# Patient Record
Sex: Female | Born: 1975 | Race: White | Hispanic: No | Marital: Single | Smoking: Former smoker
Health system: Southern US, Community
[De-identification: ages and names within clinical notes are randomized; demographics above are authoritative.]

## PROBLEM LIST (undated history)

## (undated) HISTORY — PX: APPENDECTOMY: SHX54

## (undated) HISTORY — PX: HIP SURGERY: SHX245

---

## 2004-03-29 ENCOUNTER — Ambulatory Visit (HOSPITAL_COMMUNITY): Admission: RE | Admit: 2004-03-29 | Discharge: 2004-03-29 | Payer: Self-pay | Admitting: Family Medicine

## 2006-05-18 ENCOUNTER — Ambulatory Visit (HOSPITAL_COMMUNITY): Admission: RE | Admit: 2006-05-18 | Discharge: 2006-05-18 | Payer: Self-pay | Admitting: *Deleted

## 2006-07-22 ENCOUNTER — Ambulatory Visit (HOSPITAL_COMMUNITY): Admission: RE | Admit: 2006-07-22 | Discharge: 2006-07-22 | Payer: Self-pay | Admitting: *Deleted

## 2010-07-09 NOTE — Op Note (Signed)
NAME:  Courtney Wilkins, Courtney Wilkins                ACCOUNT NO.:  [id number]   MEDICAL RECORD NO.:  [id number]          PATIENT TYPE:  AMB   LOCATION:  SDC                           FACILITY:  [facility]   PHYSICIAN:   B. Earlene Plater, M.D.  DATE OF BIRTH:  [birth year]   DATE OF PROCEDURE:  07/22/2006  DATE OF DISCHARGE:                               OPERATIVE REPORT   PREOPERATIVE DIAGNOSES:  1. Infertility.  2. Dysmenorrhea.   POSTOPERATIVE DIAGNOSES:  1. Infertility.  2. Dysmenorrhea.   PROCEDURE:  1. Diagnostic open laparoscopy.  2. Carbon dioxide laser ablation of endometriosis.   SURGEON:  Chester Holstein. Earlene Plater, M.D.   ASSISTANT:  None.   ANESTHESIA:  General.   SPECIMENS:  None.   BLOOD LOSS:  Minimal.   COMPLICATIONS:  None.   FINDINGS:  Endometriosis of the posterior cul-de-sac and uterosacral  ligaments, right greater than left, and right ovary posteriorly.  Otherwise, normal-appearing uterus, tubes and ovaries, normal appendix,  normal upper abdomen.   INDICATION:  Patient with the above history with a several-year history  of infertility and associated dysmenorrhea, previously normal basic  infertility workup for diagnostic laparoscopy with suspicion of  endometriosis, given the additional history of dysmenorrhea.  The  patient was advised of the risks of surgery including infection,  bleeding and damage to surrounding organs.   PROCEDURE:  The patient was taken to the operating room and general  anesthesia obtained.  She was prepped and draped in standard fashion.  Foley catheter was placed in the bladder.  Exam under anesthesia showed  a retroverted normal-size uterus.  Speculum was inserted, Hulka  tenaculum inserted and attached to the posterior lip of the cervix.   A 10-mm incision was placed in the umbilicus and carried sharply to the  fascia.  The fascia was divided sharply and elevated with the Kocher  clamps, posterior sheath and peritoneum elevated with an Allis clamp  and  entered sharply.  A pursestring suture of 0 Vicryl was placed around the  fascial defect.  Hasson cannula was inserted and secured.  Pneumoperitoneum was obtained with CO2 gas.  Trendelenburg position  obtained and a 5-mm port placed in the left lower quadrant.  Bowel was  mobilized superiorly, pelvis and abdomen inspected.  Endometriosis was  primarily in the posterior cul-de-sac, especially on the left  uterosacral ligament and left utero-ovarian ligament, as well as an  implant on the ovary posteriorly near the utero-ovarian ligament.  Each  area implant size was from 2-4 mm.  In addition, scattered 1- to 2-mm  implants were noted on the right uterosacral ligament.  The course of  each ureter was followed from the pelvic brim down to the posterior cul-  de-sac.  One implant was very close to the course of the left ureter  near the uterosacral ligament insertion at the uterus.  Therefore, this  area was ablated only to a depth of 1 mm.  The other areas were clearly  well away from the ureter and were burned to a depth of 3-4 mm with the  CO2 laser placed on 10  watts continuous.  No other abnormalities were  noted, other than some mild right pericecal adhesions to the abdominal  wall, although the appendix itself appeared normal.  Therefore, the  procedure was terminated.   The inferior port was removed; its site was hemostatic.  Scope was  removed, gas released and Hasson cannula removed.  The umbilical  incision was elevated with [employer] retractors and pursestring suture  snugged down; this obliterated the fascial defect and no intra-abdominal  contents herniated through prior to closure.  Skin was closed at the  umbilicus and the subcutaneous tissue with 4-0 Vicryl and the skin at  each site was closed with Dermabond.   The cervical Hulka tenaculum was removed, as was the Foley, and the  cervix was hemostatic.   The patient tolerated the procedure well and there were no   complications.  She was taken to the recovery room, awake, alert and in  stable condition.      Gerri Spore B. Earlene Plater, M.D.  Electronically Signed     WBD/MEDQ  D:  07/22/2006  T:  07/22/2006  Job:  [id number]

## 2014-11-12 ENCOUNTER — Ambulatory Visit
Admission: EM | Admit: 2014-11-12 | Discharge: 2014-11-12 | Disposition: A | Discharge status: Home / Self Care | Payer: BLUE CROSS/BLUE SHIELD | Attending: Family Medicine | Admitting: Family Medicine

## 2014-11-12 DIAGNOSIS — J069 Acute upper respiratory infection, unspecified: Secondary | ICD-10-CM

## 2014-11-12 MED ORDER — AZITHROMYCIN 250 MG PO TABS: ORAL_TABLET | ORAL | Status: DC

## 2014-11-12 NOTE — ED Provider Notes (Signed)
CSN: [id number]     Arrival date & time 11/12/14  1258 History   First MD Initiated Contact with Patient 11/12/14 1440     Chief Complaint  Patient presents with  . Fever  . Sore Throat  . Ear Fullness  . Cough   (Consider location/radiation/quality/duration/timing/severity/associated sxs/prior Treatment) HPI   This a 39 year old female who presents with fever up to 100.7 chills and hot flashes for the last 3 days. In addition she has had body aches left ear pain and severe sore throat which has lessened somewhat over the 1 day. Been coughing up green phlegm but has clear mucus from her nose. Her left ear throbbing. 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 She has not received her flu shot yet. History reviewed. No pertinent past medical history. Past Surgical History  Procedure Laterality Date  . Appendectomy    . Cesarean section     History reviewed. No pertinent family history. Social History  Substance Use Topics  . Smoking status: Former Games developer  . Smokeless tobacco: Never Used  . Alcohol Use: No   OB History    No data available     Review of Systems  Constitutional: Positive for fever, chills and fatigue.  HENT: Positive for congestion, ear pain, postnasal drip, rhinorrhea and sore throat.   Respiratory: Positive for cough.   All other systems reviewed and are negative.   Allergies  Review of patient's allergies indicates no known allergies.  Home Medications   Prior to Admission medications   Medication Sig Start Date End Date Taking? Authorizing Provider  azithromycin (ZITHROMAX Z-PAK) 250 MG tablet Take as per package instructions 11/12/14   Lutricia Feil, PA-C   Meds Ordered and Administered this Visit  Medications - No data to display  BP 106/72 mmHg  Pulse 81  Temp(Src) 97.8 F (36.6 C) (Oral)  Resp 20  Ht  (1.676 m)  Wt 154 lb (69.854 kg)  BMI 24.87 kg/m2  SpO2 100%  LMP  11/12/2014 No data found.   Physical Exam  Constitutional: She is oriented to person, place, and time. She appears well-developed and well-nourished. No distress.  HENT:  Head: Normocephalic and atraumatic.  Right Ear: External ear normal.  Nose: Nose normal.  Mouth/Throat: Oropharynx is clear and moist. No oropharyngeal exudate.  LeftTM is mildly erythematous is no retraction or bulging seen. Pharynx is mildly erythematous but without any swelling or exudate present. Assessment some tenderness to palpation of the neck just inferior to her ear no adenopathy is palpable.  Eyes: Pupils are equal, round, and reactive to light.  Neck: Neck supple. No thyromegaly present.  Pulmonary/Chest: Breath sounds normal. No respiratory distress. She has no wheezes. She has no rales.  Musculoskeletal: Normal range of motion. She exhibits no edema or tenderness.  Lymphadenopathy:    She has no cervical adenopathy.  Neurological: She is alert and oriented to person, place, and time.  Skin: Skin is warm and dry. She is not diaphoretic.  Psychiatric: She has a normal mood and affect. Her behavior is normal. Judgment and thought content normal.  Nursing note and vitals reviewed.   ED Course  Procedures (including critical care time)  Labs Review Labs Reviewed - No data to display  Imaging Review No results found.   Visual Acuity Review  Right Eye Distance:   Left Eye Distance:   Bilateral Distance:    Right Eye Near:   Left  Eye Near:    Bilateral Near:         MDM   1. URI, acute    New Prescriptions   AZITHROMYCIN (ZITHROMAX Z-PAK) 250 MG TABLET    Take as per package instructions   Plan: 1. Diagnosis reviewed with patient 2. rx as per orders; risks, benefits, potential side effects reviewed with patient 3. Recommend supportive treatment with fluids rest. Ibuprofen for fever/pain 4. F/u prn if symptoms worsen or don't improve    Lutricia Feil, PA-C 11/12/14 1516

## 2014-11-12 NOTE — ED Notes (Signed)
Patient started feeling sick about 3 days ago. Has had fever up to 100.7 she reports. She has been feeling stuffed up, sore throat , ear full/pressure, and cough.

## 2014-11-12 NOTE — Discharge Instructions (Signed)
Upper Respiratory Infection, Adult An upper respiratory infection (URI) is also sometimes known as the common cold. The upper respiratory tract includes the nose, sinuses, throat, trachea, and bronchi. Bronchi are the airways leading to the lungs. Most people improve within 1 week, but symptoms can last up to 2 weeks. A residual cough may last even longer.  CAUSES Many different viruses can infect the tissues lining the upper respiratory tract. The tissues become irritated and inflamed and often become very moist. Mucus production is also common. A cold is contagious. You can easily spread the virus to others by oral contact. This includes kissing, sharing a glass, coughing, or sneezing. Touching your mouth or nose and then touching a surface, which is then touched by another person, can also spread the virus. SYMPTOMS  Symptoms typically develop 1 to 3 days after you come in contact with a cold virus. Symptoms vary from person to person. They may include:  Runny nose.  Sneezing.  Nasal congestion.  Sinus irritation.  Sore throat.  Loss of voice (laryngitis).  Cough.  Fatigue.  Muscle aches.  Loss of appetite.  Headache.  Low-grade fever. DIAGNOSIS  You might diagnose your own cold based on familiar symptoms, since most people get a cold 2 to 3 times a year. Your caregiver can confirm this based on your exam. Most importantly, your caregiver can check that your symptoms are not due to another disease such as strep throat, sinusitis, pneumonia, asthma, or epiglottitis. Blood tests, throat tests, and X-rays are not necessary to diagnose a common cold, but they may sometimes be helpful in excluding other more serious diseases. Your caregiver will decide if any further tests are required. RISKS AND COMPLICATIONS  You may be at risk for a more severe case of the common cold if you smoke cigarettes, have chronic heart disease (such as heart failure) or lung disease (such as asthma), or if  you have a weakened immune system. The very young and very old are also at risk for more serious infections. Bacterial sinusitis, middle ear infections, and bacterial pneumonia can complicate the common cold. The common cold can worsen asthma and chronic obstructive pulmonary disease (COPD). Sometimes, these complications can require emergency medical care and may be life-threatening. PREVENTION  The best way to protect against getting a cold is to practice good hygiene. Avoid oral or hand contact with people with cold symptoms. Wash your hands often if contact occurs. There is no clear evidence that vitamin C, vitamin E, echinacea, or exercise reduces the chance of developing a cold. However, it is always recommended to get plenty of rest and practice good nutrition. TREATMENT  Treatment is directed at relieving symptoms. There is no cure. Antibiotics are not effective, because the infection is caused by a virus, not by bacteria. Treatment may include:  Increased fluid intake. Sports drinks offer valuable electrolytes, sugars, and fluids.  Breathing heated mist or steam (vaporizer or shower).  Eating chicken soup or other clear broths, and maintaining good nutrition.  Getting plenty of rest.  Using gargles or lozenges for comfort.  Controlling fevers with ibuprofen or acetaminophen as directed by your caregiver.  Increasing usage of your inhaler if you have asthma. Zinc gel and zinc lozenges, taken in the first 24 hours of the common cold, can shorten the duration and lessen the severity of symptoms. Pain medicines may help with fever, muscle aches, and throat pain. A variety of non-prescription medicines are available to treat congestion and runny nose. Your caregiver   can make recommendations and may suggest nasal or lung inhalers for other symptoms.  HOME CARE INSTRUCTIONS   Only take over-the-counter or prescription medicines for pain, discomfort, or fever as directed by your  caregiver.  Use a warm mist humidifier or inhale steam from a shower to increase air moisture. This may keep secretions moist and make it easier to breathe.  Drink enough water and fluids to keep your urine clear or pale yellow.  Rest as needed.  Return to work when your temperature has returned to normal or as your caregiver advises. You may need to stay home longer to avoid infecting others. You can also use a face mask and careful hand washing to prevent spread of the virus. SEEK MEDICAL CARE IF:   After the first few days, you feel you are getting worse rather than better.  You need your caregiver's advice about medicines to control symptoms.  You develop chills, worsening shortness of breath, or brown or red sputum. These may be signs of pneumonia.  You develop yellow or brown nasal discharge or pain in the face, especially when you bend forward. These may be signs of sinusitis.  You develop a fever, swollen neck glands, pain with swallowing, or white areas in the back of your throat. These may be signs of strep throat. SEEK IMMEDIATE MEDICAL CARE IF:   You have a fever.  You develop severe or persistent headache, ear pain, sinus pain, or chest pain.  You develop wheezing, a prolonged cough, cough up blood, or have a change in your usual mucus (if you have chronic lung disease).  You develop sore muscles or a stiff neck. Document Released: 08/06/2000 Document Revised: 05/05/2011 Document Reviewed: 05/18/2013 ExitCare Patient Information 2015 [employer]. This information is not intended to replace advice given to you by your health care provider. Make sure you discuss any questions you have with your health care provider.  

## 2018-09-28 ENCOUNTER — Other Ambulatory Visit: Payer: Self-pay

## 2018-09-28 DIAGNOSIS — Z20822 Contact with and (suspected) exposure to covid-19: Secondary | ICD-10-CM

## 2018-09-29 LAB — NOVEL CORONAVIRUS, NAA: SARS-CoV-2, NAA: NOT DETECTED

## 2019-03-04 ENCOUNTER — Ambulatory Visit: Payer: BC Managed Care – PPO | Attending: Internal Medicine

## 2019-03-15 ENCOUNTER — Ambulatory Visit: Payer: BC Managed Care – PPO | Attending: Internal Medicine

## 2019-03-15 DIAGNOSIS — Z20822 Contact with and (suspected) exposure to covid-19: Secondary | ICD-10-CM | POA: Insufficient documentation

## 2019-03-16 LAB — NOVEL CORONAVIRUS, NAA: SARS-CoV-2, NAA: NOT DETECTED

## 2019-03-18 ENCOUNTER — Ambulatory Visit: Payer: BC Managed Care – PPO | Attending: Internal Medicine

## 2019-03-18 DIAGNOSIS — Z20822 Contact with and (suspected) exposure to covid-19: Secondary | ICD-10-CM

## 2019-03-20 LAB — NOVEL CORONAVIRUS, NAA: SARS-CoV-2, NAA: DETECTED — AB

## 2019-12-05 ENCOUNTER — Ambulatory Visit: Payer: Self-pay

## 2019-12-05 ENCOUNTER — Ambulatory Visit (INDEPENDENT_AMBULATORY_CARE_PROVIDER_SITE_OTHER): Payer: BC Managed Care – PPO | Admitting: Orthopaedic Surgery

## 2019-12-05 VITALS — Ht 66.0 in | Wt 133.0 lb

## 2019-12-05 DIAGNOSIS — M25559 Pain in unspecified hip: Secondary | ICD-10-CM

## 2019-12-05 DIAGNOSIS — M25551 Pain in right hip: Secondary | ICD-10-CM | POA: Diagnosis not present

## 2019-12-05 MED ORDER — METHYLPREDNISOLONE 4 MG PO TABS
ORAL_TABLET | ORAL | 0 refills | Status: DC
Start: 2019-12-05 — End: 2022-11-10

## 2019-12-05 NOTE — Progress Notes (Signed)
Office Visit Note   Patient: Courtney Wilkins           Date of Birth: 10/01/1975           MRN: [id number] Visit Date: 12/05/2019              Requested by: No referring provider defined for this encounter. PCP: Patient, No Pcp Per   Assessment & Plan: Visit Diagnoses:  1. Hip pain     Plan: I do feel good that the patient is feeling little bit better than that she did 2 weeks ago when she was even having problems weightbearing.  She has not taken any anti-inflammatories so I like to try a 6-day steroid taper and have her avoid significant pivoting activities or trying to "pop her hip".  My neck step would be to obtain an MRI arthrogram if she continues to have the stabbing pain in the groin area.  We will reevaluate her in 2 weeks.  All questions and concerns were answered and addressed.  Follow-Up Instructions: Return in about 2 years (around 12/04/2021).   Orders:  Orders Placed This Encounter  Procedures  . XR HIP UNILAT W OR W/O PELVIS 1V RIGHT   Meds ordered this encounter  Medications  . methylPREDNISolone (MEDROL) 4 MG tablet    Sig: Medrol dose pack. Take as instructed    Dispense:  21 tablet    Refill:  0      Procedures: No procedures performed   Clinical Data: No additional findings.   Subjective: Chief Complaint  Patient presents with  . Right Hip - Pain  Patient is a very pleasant 44 year old female who reports a 2-week history of right hip pain.  She got a severe sharp pain in the groin when she was pivoting getting out of a booth at [employer].  She had difficulty weightbearing after that.  Right now she states that she feels like her hip needs to "pop".  She still reports pain in the groin and deep in the hip joint itself.  She has been trying to limit her activities.  She works as a Armed forces operational officer.  She never has injured that right hip before.  She denies any other acute change in medical status.  HPI  Review of Systems There is currently no  reported headache, chest pain, shortness of breath, fever, chills, nausea, vomiting  Objective: Vital Signs: Ht 5\' 6"  (1.676 m)   Wt 133 lb (60.3 kg)   BMI 21.47 kg/m   Physical Exam She is alert and oriented x3 and in no acute distress Ortho Exam Examination of her right hip shows that it moves smoothly and fluidly but does have pain in the groin with extremes of rotation.  Her abduction and flexion are normal as well as her rotation. Specialty Comments:  No specialty comments available.  Imaging: XR HIP UNILAT W OR W/O PELVIS 1V RIGHT  Result Date: 12/05/2019 An AP and lateral of the right hip including AP of the pelvis shows no acute findings.    PMFS History: There are no problems to display for this patient.  No past medical history on file.  No family history on file.  Past Surgical History:  Procedure Laterality Date  . APPENDECTOMY    . CESAREAN SECTION     Social History   Occupational History  . Not on file  Tobacco Use  . Smoking status: Former 02/04/2020  . Smokeless tobacco: Never Used  Substance and Sexual Activity  .  Alcohol use: No  . Drug use: Not on file  . Sexual activity: Not on file

## 2019-12-19 ENCOUNTER — Encounter: Payer: Self-pay | Admitting: Orthopaedic Surgery

## 2019-12-19 ENCOUNTER — Ambulatory Visit (INDEPENDENT_AMBULATORY_CARE_PROVIDER_SITE_OTHER): Payer: BC Managed Care – PPO | Admitting: Orthopaedic Surgery

## 2019-12-19 DIAGNOSIS — M25551 Pain in right hip: Secondary | ICD-10-CM | POA: Diagnosis not present

## 2019-12-19 NOTE — Progress Notes (Signed)
The patient comes in with continued right hip and groin pain.  This is only in the groin area.  This occurred after she felt a pop in that hip when she was pivoting.  It still wakes her up at night.  It still hurts with weightbearing and when she pivots with her right hip.  She has never injured this before.  We tried to get this to calm down with just oral anti-inflammatories as well as activity modification and rest.  We even tried a steroid taper.  Examination of her right hip still shows pain in the groin with internal and external rotation of the right hip.  Her plain films were reviewed of her pelvis and right hip and it shows a normal-appearing hip.  At this point I am concerned about her having a labral tear based on her clinical exam as well as her story and signs and symptoms.  We do need to obtain a MRI arthrogram of the right hip to rule out a labral tear.  She agrees with this treatment plan.  All questions and concerns were answered and addressed.  We will see her back in follow-up after the MRI.

## 2019-12-21 ENCOUNTER — Other Ambulatory Visit: Payer: Self-pay

## 2019-12-21 DIAGNOSIS — M25559 Pain in unspecified hip: Secondary | ICD-10-CM

## 2020-01-09 ENCOUNTER — Encounter: Payer: Self-pay | Admitting: Orthopaedic Surgery

## 2020-01-10 ENCOUNTER — Other Ambulatory Visit: Payer: Self-pay | Admitting: Orthopaedic Surgery

## 2020-01-10 NOTE — Telephone Encounter (Signed)
Pt is already schedued at [facility] imaging

## 2020-01-27 ENCOUNTER — Ambulatory Visit
Admission: RE | Admit: 2020-01-27 | Discharge: 2020-01-27 | Disposition: A | Discharge status: Home / Self Care | Payer: BC Managed Care – PPO | Source: Ambulatory Visit | Attending: Orthopaedic Surgery | Admitting: Orthopaedic Surgery

## 2020-01-27 ENCOUNTER — Other Ambulatory Visit: Payer: Self-pay

## 2020-01-27 DIAGNOSIS — M25559 Pain in unspecified hip: Secondary | ICD-10-CM

## 2020-01-27 MED ORDER — IOPAMIDOL (ISOVUE-M 200) INJECTION 41%
15.0000 mL | Freq: Once | INTRAMUSCULAR | Status: AC
  Administered 2020-01-27: 15 mL via INTRA_ARTICULAR

## 2020-01-30 ENCOUNTER — Telehealth: Payer: Self-pay | Admitting: Orthopaedic Surgery

## 2020-01-30 DIAGNOSIS — M25559 Pain in unspecified hip: Secondary | ICD-10-CM

## 2020-01-30 NOTE — Telephone Encounter (Signed)
Patient called asked if she can be called with the results of her MRI and what the next plan of care will be? The number to contact patient is [phone]

## 2020-01-30 NOTE — Telephone Encounter (Signed)
Referral placed in chart  

## 2020-01-30 NOTE — Telephone Encounter (Signed)
I spoke with her.  She needs a referral to [facility] to Dr. 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

## 2020-01-31 ENCOUNTER — Telehealth: Payer: Self-pay | Admitting: Orthopaedic Surgery

## 2020-01-31 DIAGNOSIS — M25551 Pain in right hip: Secondary | ICD-10-CM

## 2020-01-31 NOTE — Telephone Encounter (Signed)
Do you have this number?

## 2020-01-31 NOTE — Telephone Encounter (Signed)
Pt called asking for the phone number to the facility she was referred to; pt states it's ok to leave a message with info. if she doesn't answer  [phone]

## 2020-02-01 NOTE — Telephone Encounter (Signed)
Is this ok?

## 2020-02-01 NOTE — Telephone Encounter (Signed)
That will be fine.  It does sometimes take a while to get into any office.  I am fine with the referral to Dr. 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

## 2020-02-01 NOTE — Telephone Encounter (Signed)
Pt called back and still hasn't heard anything regarding her referral to Dr. Caswell Corwin office at [facility]. Pt asked if she can be referred to Dr. Junita Push at [facility] due to the fact that her pain is getting worse by the day.

## 2020-02-02 ENCOUNTER — Telehealth: Payer: Self-pay

## 2020-02-02 NOTE — Telephone Encounter (Signed)
Patient called stating that Dr. Aundria Rud office states that they can't get her in for surgery until the new year. She states that starting the new year her insurance is changing and per Dr. Aundria Rud office that particular insurance will make her have cortisone injection in the hip first which she does not think will help. She just wanted to speak to you about all this I told her more than likely anyone we send her to may be booked for surgery until the first of the year, that it is just that time of year

## 2020-02-02 NOTE — Telephone Encounter (Signed)
Referral placed in chart  

## 2020-02-02 NOTE — Telephone Encounter (Signed)
I agree with you.  There is unfortunately nothing that I can really do at all.  Everybody has their surgeries booked up and are backed up.  Some of this is related to a multitude of surgeries that are needed by the end of the year as well as staffing issues.  The only thing I can recommend in the interim would be a steroid injection but there is nothing else I can recommend from a surgical standpoint or referral standpoint.

## 2020-02-02 NOTE — Telephone Encounter (Signed)
Patient called she sated she would like a call back regarding a referral she received she wants to let doctor blackman know what the office is saying. CB:[phone]

## 2020-02-03 NOTE — Telephone Encounter (Signed)
Patient aware of the below message  

## 2020-02-09 ENCOUNTER — Other Ambulatory Visit: Payer: Self-pay | Admitting: Rehabilitative and Restorative Service Providers"

## 2020-02-09 DIAGNOSIS — M25551 Pain in right hip: Secondary | ICD-10-CM

## 2020-02-16 ENCOUNTER — Encounter: Payer: Self-pay | Admitting: Radiology

## 2020-02-16 ENCOUNTER — Ambulatory Visit
Admission: RE | Admit: 2020-02-16 | Discharge: 2020-02-16 | Disposition: A | Discharge status: Home / Self Care | Payer: BC Managed Care – PPO | Source: Ambulatory Visit | Attending: Rehabilitative and Restorative Service Providers" | Admitting: Rehabilitative and Restorative Service Providers"

## 2020-02-16 DIAGNOSIS — M25551 Pain in right hip: Secondary | ICD-10-CM

## 2020-02-16 MED ORDER — TRIAMCINOLONE ACETONIDE 40 MG/ML IJ SUSP (RADIOLOGY)
40.0000 mg | Freq: Once | INTRAMUSCULAR | Status: AC
  Administered 2020-02-16: 40 mg via INTRA_ARTICULAR

## 2020-02-16 MED ORDER — IOPAMIDOL (ISOVUE-M 200) INJECTION 41%
1.0000 mL | Freq: Once | INTRAMUSCULAR | Status: AC
  Administered 2020-02-16: 1 mL via INTRA_ARTICULAR

## 2021-01-16 ENCOUNTER — Other Ambulatory Visit: Payer: Self-pay | Admitting: Obstetrics and Gynecology

## 2021-01-16 DIAGNOSIS — Z1231 Encounter for screening mammogram for malignant neoplasm of breast: Secondary | ICD-10-CM

## 2021-02-01 ENCOUNTER — Other Ambulatory Visit: Payer: Self-pay

## 2021-02-01 ENCOUNTER — Ambulatory Visit
Admission: RE | Admit: 2021-02-01 | Discharge: 2021-02-01 | Disposition: A | Discharge status: Home / Self Care | Payer: Managed Care, Other (non HMO) | Source: Ambulatory Visit | Attending: Obstetrics and Gynecology | Admitting: Obstetrics and Gynecology

## 2021-02-01 DIAGNOSIS — Z1231 Encounter for screening mammogram for malignant neoplasm of breast: Secondary | ICD-10-CM | POA: Diagnosis present

## 2021-02-07 ENCOUNTER — Other Ambulatory Visit: Payer: Self-pay | Admitting: Obstetrics and Gynecology

## 2021-02-07 DIAGNOSIS — R928 Other abnormal and inconclusive findings on diagnostic imaging of breast: Secondary | ICD-10-CM

## 2021-02-07 DIAGNOSIS — N6489 Other specified disorders of breast: Secondary | ICD-10-CM

## 2021-02-15 ENCOUNTER — Ambulatory Visit
Admission: RE | Admit: 2021-02-15 | Discharge: 2021-02-15 | Disposition: A | Discharge status: Home / Self Care | Payer: Managed Care, Other (non HMO) | Source: Ambulatory Visit | Attending: Obstetrics and Gynecology | Admitting: Obstetrics and Gynecology

## 2021-02-15 ENCOUNTER — Other Ambulatory Visit: Payer: Self-pay

## 2021-02-15 DIAGNOSIS — R928 Other abnormal and inconclusive findings on diagnostic imaging of breast: Secondary | ICD-10-CM | POA: Insufficient documentation

## 2021-02-15 DIAGNOSIS — N6489 Other specified disorders of breast: Secondary | ICD-10-CM | POA: Diagnosis present

## 2021-03-21 ENCOUNTER — Other Ambulatory Visit: Payer: Self-pay | Admitting: Orthopedic Surgery

## 2021-03-21 DIAGNOSIS — M25551 Pain in right hip: Secondary | ICD-10-CM

## 2021-04-12 ENCOUNTER — Ambulatory Visit
Admission: RE | Admit: 2021-04-12 | Discharge: 2021-04-12 | Disposition: A | Discharge status: Home / Self Care | Payer: Managed Care, Other (non HMO) | Source: Ambulatory Visit | Attending: Orthopedic Surgery | Admitting: Orthopedic Surgery

## 2021-04-12 ENCOUNTER — Other Ambulatory Visit: Payer: Self-pay

## 2021-04-12 DIAGNOSIS — M25551 Pain in right hip: Secondary | ICD-10-CM

## 2021-04-12 MED ORDER — TRIAMCINOLONE ACETONIDE 40 MG/ML IJ SUSP (RADIOLOGY)
40.0000 mg | Freq: Once | INTRAMUSCULAR | Status: AC
  Administered 2021-04-12: 40 mg via INTRA_ARTICULAR

## 2021-04-12 MED ORDER — IOPAMIDOL (ISOVUE-M 200) INJECTION 41%
1.0000 mL | Freq: Once | INTRAMUSCULAR | Status: AC
  Administered 2021-04-12: 1 mL via INTRA_ARTICULAR

## 2021-07-23 ENCOUNTER — Other Ambulatory Visit: Payer: Self-pay | Admitting: Obstetrics and Gynecology

## 2021-07-23 DIAGNOSIS — N6489 Other specified disorders of breast: Secondary | ICD-10-CM

## 2021-08-09 ENCOUNTER — Ambulatory Visit
Admission: RE | Admit: 2021-08-09 | Discharge: 2021-08-09 | Disposition: A | Discharge status: Home / Self Care | Payer: Managed Care, Other (non HMO) | Source: Ambulatory Visit | Attending: Obstetrics and Gynecology | Admitting: Obstetrics and Gynecology

## 2021-08-09 DIAGNOSIS — N6489 Other specified disorders of breast: Secondary | ICD-10-CM | POA: Insufficient documentation

## 2021-12-05 ENCOUNTER — Other Ambulatory Visit: Payer: Self-pay | Admitting: Orthopedic Surgery

## 2021-12-05 DIAGNOSIS — M25551 Pain in right hip: Secondary | ICD-10-CM

## 2021-12-11 ENCOUNTER — Other Ambulatory Visit: Payer: Managed Care, Other (non HMO)

## 2021-12-18 ENCOUNTER — Other Ambulatory Visit: Payer: Managed Care, Other (non HMO)

## 2022-11-10 ENCOUNTER — Encounter: Payer: Self-pay | Admitting: Obstetrics & Gynecology

## 2022-11-10 ENCOUNTER — Other Ambulatory Visit (HOSPITAL_COMMUNITY)
Admission: RE | Admit: 2022-11-10 | Discharge: 2022-11-10 | Disposition: A | Discharge status: Home / Self Care | Payer: BC Managed Care – PPO | Source: Ambulatory Visit | Attending: Obstetrics & Gynecology | Admitting: Obstetrics & Gynecology

## 2022-11-10 ENCOUNTER — Ambulatory Visit (INDEPENDENT_AMBULATORY_CARE_PROVIDER_SITE_OTHER): Payer: BC Managed Care – PPO | Admitting: Obstetrics & Gynecology

## 2022-11-10 VITALS — BP 112/71 | HR 83 | Ht 66.0 in | Wt 133.0 lb

## 2022-11-10 DIAGNOSIS — Z1211 Encounter for screening for malignant neoplasm of colon: Secondary | ICD-10-CM

## 2022-11-10 DIAGNOSIS — Z1151 Encounter for screening for human papillomavirus (HPV): Secondary | ICD-10-CM | POA: Diagnosis not present

## 2022-11-10 DIAGNOSIS — Z1231 Encounter for screening mammogram for malignant neoplasm of breast: Secondary | ICD-10-CM

## 2022-11-10 DIAGNOSIS — R399 Unspecified symptoms and signs involving the genitourinary system: Secondary | ICD-10-CM | POA: Diagnosis not present

## 2022-11-10 DIAGNOSIS — Z01419 Encounter for gynecological examination (general) (routine) without abnormal findings: Secondary | ICD-10-CM

## 2022-11-10 DIAGNOSIS — Z1339 Encounter for screening examination for other mental health and behavioral disorders: Secondary | ICD-10-CM | POA: Diagnosis not present

## 2022-11-10 DIAGNOSIS — Z3041 Encounter for surveillance of contraceptive pills: Secondary | ICD-10-CM

## 2022-11-10 MED ORDER — NORGESTIMATE-ETH ESTRADIOL 0.25-35 MG-MCG PO TABS: 1.0000 | ORAL_TABLET | Freq: Every day | ORAL | 5 refills | Status: DC

## 2022-11-10 NOTE — Progress Notes (Signed)
GYNECOLOGY ANNUAL PREVENTATIVE CARE ENCOUNTER NOTE  History:     Courtney Wilkins is a 47 y.o. G45P0 female here for a routine annual gynecologic exam.  Current complaints: had suprapubic pain and blood in urine a few days ago, no current symptoms.  History of UTIs, desires testing for this.   Denies abnormal vaginal bleeding, discharge, pelvic pain, problems with intercourse or other gynecologic concerns.    Gynecologic History Patient's last menstrual period was 10/28/2022 (exact date). Contraception: OCP (estrogen/progesterone) Last Pap: 10/2021. Result was normal  Last Mammogram: 08/09/2021.  Result was normal  Obstetric History OB History  Gravida Para Term Preterm AB Living  2         2  SAB IAB Ectopic Multiple Live Births               # Outcome Date GA Lbr Len/2nd Weight Sex Type Anes PTL Lv  2 Gravida           1 Gravida             Obstetric Comments  2 C-Section's.     History reviewed. No pertinent past medical history.  Past Surgical History:  Procedure Laterality Date   APPENDECTOMY     CESAREAN SECTION     HIP SURGERY      No current outpatient medications on file prior to visit.   No current facility-administered medications on file prior to visit.    No Known Allergies  Social History:  reports that she has quit smoking. She has never used smokeless tobacco. She reports current alcohol use. She reports that she does not use drugs.  Family History  Problem Relation Age of Onset   Hypertension Mother    Heart attack Mother    Breast cancer Neg Hx     The following portions of the patient's history were reviewed and updated as appropriate: allergies, current medications, past family history, past medical history, past social history, past surgical history and problem list.  Review of Systems Pertinent items noted in HPI and remainder of comprehensive ROS otherwise negative.  Physical Exam:  BP 112/71   Pulse 83   Ht 5\' 6"  (1.676 m)   Wt 133  lb (60.3 kg)   LMP 10/28/2022 (Exact Date)   BMI 21.47 kg/m  CONSTITUTIONAL: Well-developed, well-nourished female in no acute distress.  HENT:  Normocephalic, atraumatic, External right and left ear normal.  EYES: Conjunctivae and EOM are normal. Pupils are equal, round, and reactive to light. No scleral icterus.  NECK: Normal range of motion, supple, no masses.  Normal thyroid.  SKIN: Skin is warm and dry. No rash noted. Not diaphoretic. No erythema. No pallor. MUSCULOSKELETAL: Normal range of motion. No tenderness.  No cyanosis, clubbing, or edema. NEUROLOGIC: Alert and oriented to person, place, and time. Normal reflexes, muscle tone coordination.  PSYCHIATRIC: Normal mood and affect. Normal behavior. Normal judgment and thought content. CARDIOVASCULAR: Normal heart rate noted, regular rhythm RESPIRATORY: Clear to auscultation bilaterally. Effort and breath sounds normal, no problems with respiration noted. BREASTS: Symmetric in size. No masses, tenderness, skin changes, nipple drainage, or lymphadenopathy bilaterally. Performed in the presence of a chaperone. ABDOMEN: Soft, no distention noted.  No tenderness, rebound or guarding.  PELVIC: Normal appearing external genitalia and urethral meatus; normal appearing vaginal mucosa and cervix.  No abnormal vaginal discharge noted.  Pap smear obtained.  Normal uterine size, no other palpable masses, no uterine or adnexal tenderness.  Performed in the presence of  a chaperone.   Assessment and Plan:    1. UTI symptoms No current symptoms but patient desires test. - Urine Culture  2. Uses oral contraception OCPs refilled. - norgestimate-ethinyl estradiol (ORTHO-CYCLEN) 0.25-35 MG-MCG tablet; Take 1 tablet by mouth daily.  Dispense: 84 tablet; Refill: 5  3. Colon cancer screening Discussed need for colon cancer screening over the age 15. Colonoscopy recommended as this is the gold standard. Patient declined colonoscopy, so discussed Cologuard.    Emphasized that positive Cologuard tests will need to be follow up with diagnostic colonoscopy. Cologuard ordered. - Cologuard  4. Breast cancer screening by mammogram Mammogram scheduled for breast cancer screening. - MM 3D SCREENING MAMMOGRAM BILATERAL BREAST; Future  5. Well woman exam with routine gynecological exam - Cytology - PAP Will follow up results of pap smear and manage accordingly. Routine preventative health maintenance measures emphasized. Please refer to After Visit Summary for other counseling recommendations.      Jaynie Collins, MD, FACOG Obstetrician & Gynecologist, [facility] for [employer], [facility]

## 2022-11-17 LAB — CYTOLOGY - PAP
Comment: NEGATIVE
Diagnosis: NEGATIVE
High risk HPV: NEGATIVE

## 2022-11-21 LAB — COLOGUARD: COLOGUARD: NEGATIVE

## 2023-08-25 ENCOUNTER — Encounter: Payer: Self-pay | Admitting: Obstetrics & Gynecology

## 2023-08-25 DIAGNOSIS — N939 Abnormal uterine and vaginal bleeding, unspecified: Secondary | ICD-10-CM

## 2023-09-10 ENCOUNTER — Telehealth: Payer: Self-pay

## 2023-09-10 MED ORDER — IBUPROFEN 800 MG PO TABS
800.0000 mg | ORAL_TABLET | Freq: Three times a day (TID) | ORAL | 1 refills | Status: AC | PRN
Start: 2023-09-10 — End: 2023-09-20

## 2023-09-10 NOTE — Telephone Encounter (Signed)
 Left message for pt to call office back to get follow up scheduled with Dr. DELENA

## 2023-09-14 ENCOUNTER — Ambulatory Visit: Admission: RE | Admit: 2023-09-14 | Source: Ambulatory Visit

## 2023-09-15 ENCOUNTER — Other Ambulatory Visit

## 2023-09-22 ENCOUNTER — Encounter: Payer: Self-pay | Admitting: Obstetrics & Gynecology

## 2023-09-22 ENCOUNTER — Ambulatory Visit: Admitting: Obstetrics & Gynecology

## 2023-09-22 VITALS — BP 99/66 | Wt 128.0 lb

## 2023-09-22 DIAGNOSIS — N939 Abnormal uterine and vaginal bleeding, unspecified: Secondary | ICD-10-CM | POA: Diagnosis not present

## 2023-09-22 NOTE — Progress Notes (Signed)
   GYNECOLOGY OFFICE VISIT NOTE  History:  Courtney Wilkins is a 48 y.o. G2P0 here today for discussion about two episodes of irregular bleeding.  Had two periods two weeks apart, this was unusual for Courtney Wilkins. Takes OCPs in a mostly continuous fashion. She is under a lot of stress lately, has a young son who is in hospice due to serious illnesses.  She has lost significant weight due to this stress.  She denies any abnormal vaginal discharge, bleeding, pelvic pain or other concerns.  History reviewed. No pertinent past medical history.  Past Surgical History:  Procedure Laterality Date   APPENDECTOMY     CESAREAN SECTION     HIP SURGERY      The following portions of the patient's history were reviewed and updated as appropriate: allergies, current medications, past family history, past medical history, past social history, past surgical history and problem list.   Health Maintenance:  Normal pap and negative HRHPV on 11/10/2022.    Review of Systems:  Pertinent items noted in HPI and remainder of comprehensive ROS otherwise negative.  Physical Exam:  BP 99/66   Wt 128 lb (58.1 kg)   BMI 20.66 kg/m  CONSTITUTIONAL: Well-developed, well-nourished female in no acute distress.  HEENT:  Normocephalic, atraumatic. External right and left ear normal. No scleral icterus.  NECK: Normal range of motion, supple, no masses noted on observation SKIN: No rash noted. Not diaphoretic. No erythema. No pallor. MUSCULOSKELETAL: Normal range of motion. No edema noted. NEUROLOGIC: Alert and oriented to person, place, and time. Normal muscle tone coordination. No cranial nerve deficit noted on observation. PSYCHIATRIC: Normal mood and affect. Normal behavior. Normal judgment and thought content. CARDIOVASCULAR: Normal heart rate noted RESPIRATORY: Effort and breath sounds normal, no problems with respiration noted ABDOMEN: No masses or other overt distention noted on observation. No tenderness.   PELVIC:  Deferred Assessment and Plan:    1. Abnormal uterine bleeding (AUB) (Primary) Support given to patient, offered resources for help and she said the hospice staff is helpful and she has a lot of friends and family. Discussed that stress, weight changes can definitely cause breakthrough bleeding episodes; this may worsen given current situation. If AUB persists/worsens/gets more concerning, lab and ultrasound evaluation may be indicated, also may need further evaluation with endometrial sampling. Will continue to observe for now.  Bleeding precautions reviewed.  She will continue OCPs for now as prescribed.  Patient was told to let us  know if we can help Courtney Wilkins in any way with Courtney Wilkins ongoing situation and with any GYN concerns.  Return for any gynecologic concerns.    I spent 30 minutes dedicated to the care of this patient including pre-visit review of records, face to face time with the patient discussing Courtney Wilkins conditions and treatments, post visit ordering of medications and appropriate tests or procedures, coordinating care and documenting this visit encounter.    GLORIS HUGGER, MD, FACOG Obstetrician & Gynecologist, [facility] for [employer],  [facility]

## 2023-10-01 IMAGING — XA DG FLUORO GUIDE NDL PLC/BX
1 series · 1 of 1 positions shown · non-contrast
Comparison: none

CLINICAL DATA: Right hip pain.  History of prior labral repair.

[Series 1: ortho adipose · 1 of 1 slices shown]
[im 1/1]
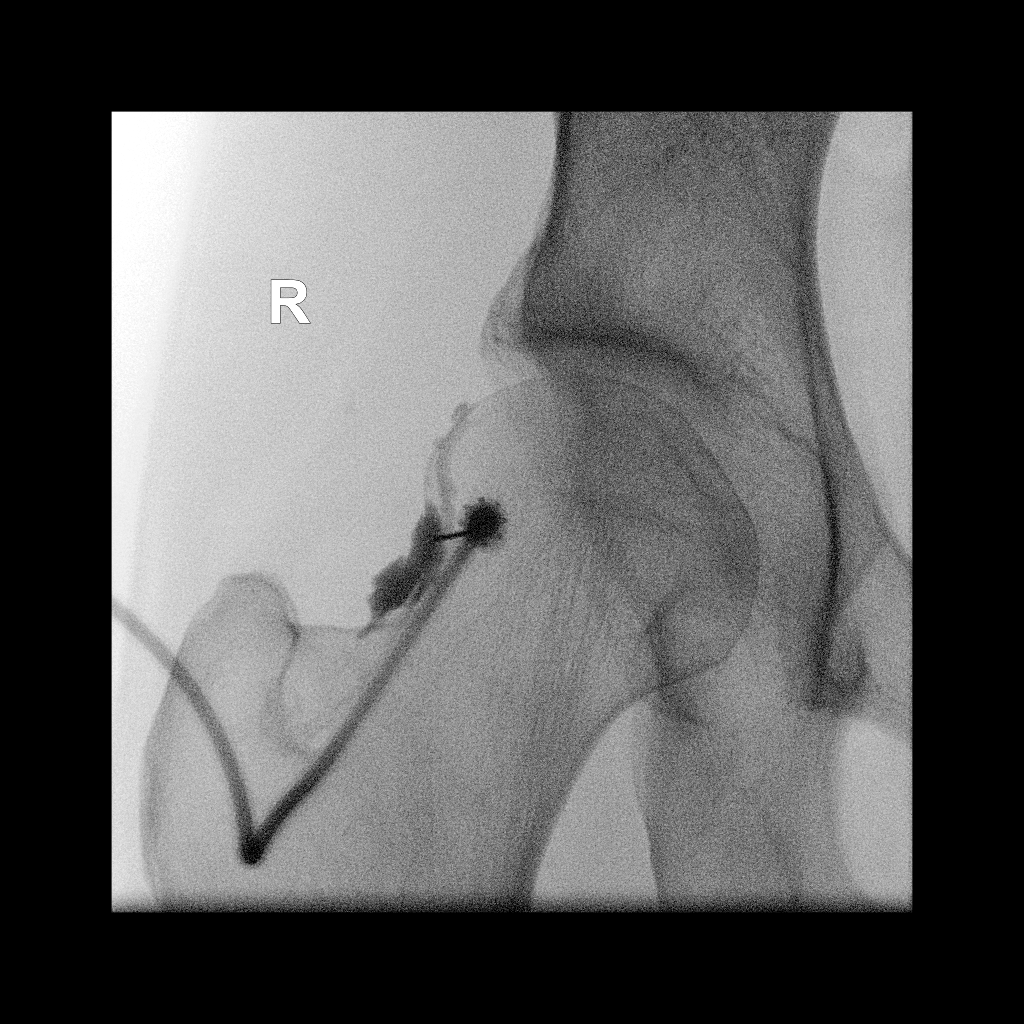

[1 of 1 positions shown; findings below may reference images not displayed]

FLUOROSCOPY TIME:  Radiation Exposure Index (as provided by the
fluoroscopic device): 1.5 mGy Kerma

PROCEDURE:
The risks and benefits of the procedure were discussed with the
patient, and written informed consent was obtained. The patient
stated no history of allergy to contrast media. A formal timeout
procedure was performed with the patient according to departmental
protocol.

The patient was placed supine on the fluoroscopy table and the right
hip joint was identified under fluoroscopy. The skin overlying the
right hip joint was subsequently cleaned with Betadine and a sterile
drape was placed over the area of interest. 2 ml 1% Lidocaine was
used to anesthetize the skin around the needle insertion site.

A 22 gauge spinal needle was inserted into the right hip joint under
fluoroscopy.

Diagnostic injection of 2 ml Isovue-M 200 iodinated contrast
demonstrates intra-articular spread without intravascular component.

40mg Kenalog and 2.5 ml bupivacaine 0.25% were then administered
into the right hip joint.

The needle was removed and hemostasis was achieved. There were no
immediate complications.

This exam was performed by Daikel Sinatra, and was supervised
and interpreted by Jadian Usher.
IMPRESSION: Technically successful right hip steroid injection.

## 2023-12-30 ENCOUNTER — Other Ambulatory Visit: Payer: Self-pay | Admitting: Obstetrics & Gynecology

## 2023-12-30 DIAGNOSIS — Z3041 Encounter for surveillance of contraceptive pills: Secondary | ICD-10-CM

## 2024-01-12 ENCOUNTER — Ambulatory Visit: Admitting: Obstetrics & Gynecology

## 2024-01-12 VITALS — BP 105/73 | HR 88 | Wt 131.0 lb

## 2024-01-12 DIAGNOSIS — Z1231 Encounter for screening mammogram for malignant neoplasm of breast: Secondary | ICD-10-CM | POA: Diagnosis not present

## 2024-01-12 DIAGNOSIS — Z01419 Encounter for gynecological examination (general) (routine) without abnormal findings: Secondary | ICD-10-CM

## 2024-01-12 NOTE — Progress Notes (Signed)
 GYNECOLOGY ANNUAL PREVENTATIVE CARE ENCOUNTER NOTE  History:    Courtney Wilkins is a 48 y.o. G2P0 female here for a routine annual gynecologic exam.  Current complaints: none.  Her son passed away August 28, 2025after long hospice care for a serious illness.  She is grieving well, she has a lot of support. Having some irregular periods, like having two a month, and skipping many weeks.  No heavy bleeding. On continuous OCPs.  Denies abnormal vaginal discharge, pelvic pain, problems with intercourse or other gynecologic concerns.  Gynecologic History Patient's last menstrual period was 12/29/2023 (approximate). Contraception: OCP (estrogen/progesterone) Last Pap: 11/10/2022. Result was normal with negative HPV Last Mammogram: 08/09/2021.  Result was benign Last Cologuard: 11/16/2022.  Result was normal  Obstetric History OB History  Gravida Para Term Preterm AB Living  2     2  SAB IAB Ectopic Multiple Live Births          # Outcome Date GA Lbr Len/2nd Weight Sex Type Anes PTL Lv  2 Gravida           1 Gravida             Obstetric Comments  2 C-Section's.     No past medical history on file.  Past Surgical History:  Procedure Laterality Date   APPENDECTOMY     CESAREAN SECTION     HIP SURGERY      Current Outpatient Medications on File Prior to Visit  Medication Sig Dispense Refill   AMBULATORY NON FORMULARY MEDICATION Medication Name: Collagen powder     Multiple Vitamins-Minerals (ONE-A-DAY WOMENS) tablet Take 1 tablet by mouth daily.     norgestimate -ethinyl estradiol  (ORTHO-CYCLEN) 0.25-35 MG-MCG tablet TAKE 1 TABLET BY MOUTH EVERY DAY 84 tablet 5   No current facility-administered medications on file prior to visit.    No Known Allergies  Social History:  reports that she has quit smoking. She has never used smokeless tobacco. She reports current alcohol use. She reports that she does not use drugs.  Family History  Problem Relation Age of Onset   Hypertension  Mother    Heart attack Mother    Breast cancer Neg Hx     The following portions of the patient's history were reviewed and updated as appropriate: allergies, current medications, past family history, past medical history, past social history, past surgical history and problem list.  Review of Systems Pertinent items noted in HPI and remainder of comprehensive ROS otherwise negative.  Physical Exam: Chaperone Neva Hyler, CMA  BP 105/73   Pulse 88   Wt 131 lb (59.4 kg)   LMP 12/29/2023 (Approximate)   BMI 21.14 kg/m  CONSTITUTIONAL: Well-developed, well-nourished female in no acute distress.  HENT:  Normocephalic, atraumatic, External right and left ear normal.  EYES: Conjunctivae and EOM are normal. Pupils are equal, round, and reactive to light. No scleral icterus.  NECK: Normal range of motion, supple, no masses observed. SKIN: Skin is warm and dry. No rash noted. Not diaphoretic. No erythema. No pallor. MUSCULOSKELETAL: Normal range of motion. No tenderness.  No cyanosis, clubbing, or edema. NEUROLOGIC: Alert and oriented to person, place, and time. Normal muscle tone coordination.  PSYCHIATRIC: Normal mood and affect. Normal behavior. Normal judgment and thought content. CARDIOVASCULAR: Normal heart rate noted, regular rhythm RESPIRATORY: Clear to auscultation bilaterally. Effort and breath sounds normal, no problems with respiration noted. BREASTS: Symmetric in size. No masses, tenderness, skin changes, nipple drainage, or lymphadenopathy bilaterally. Performed in the  presence of a chaperone. ABDOMEN: Soft, no distention noted.  No tenderness, rebound or guarding.  PELVIC: Defrred  Assessment and Plan:     1. Breast cancer screening by mammogram Mammogram scheduled for breast cancer screening. - MM 3D SCREENING MAMMOGRAM BILATERAL BREAST; Future  2. Well woman exam with routine gynecological exam (Primary) Pap is up to date.  Colon cancer screening is up to date. Support  given to patient, advised to reach out if there was anything we could do to help her. Bleeding precautions reviewed. Routine preventative health maintenance measures emphasized. Please refer to After Visit Summary for other counseling recommendations.      GLORIS HUGGER, MD, FACOG Obstetrician & Gynecologist, [facility] for [employer], [facility]

## 2024-01-28 IMAGING — MG MM DIGITAL DIAGNOSTIC UNILAT*L* W/ TOMO W/ CAD
4 series · 4 of 12 positions shown · non-contrast
Comparison: Previous exam(s).

CLINICAL DATA: Six-month follow-up of a left breast asymmetry

EXAM:
DIGITAL DIAGNOSTIC UNILATERAL LEFT MAMMOGRAM WITH TOMOSYNTHESIS AND
CAD
TECHNIQUE: Left digital diagnostic mammography and breast tomosynthesis was
performed. The images were evaluated with computer-aided detection.

[L MLO synth-2D]
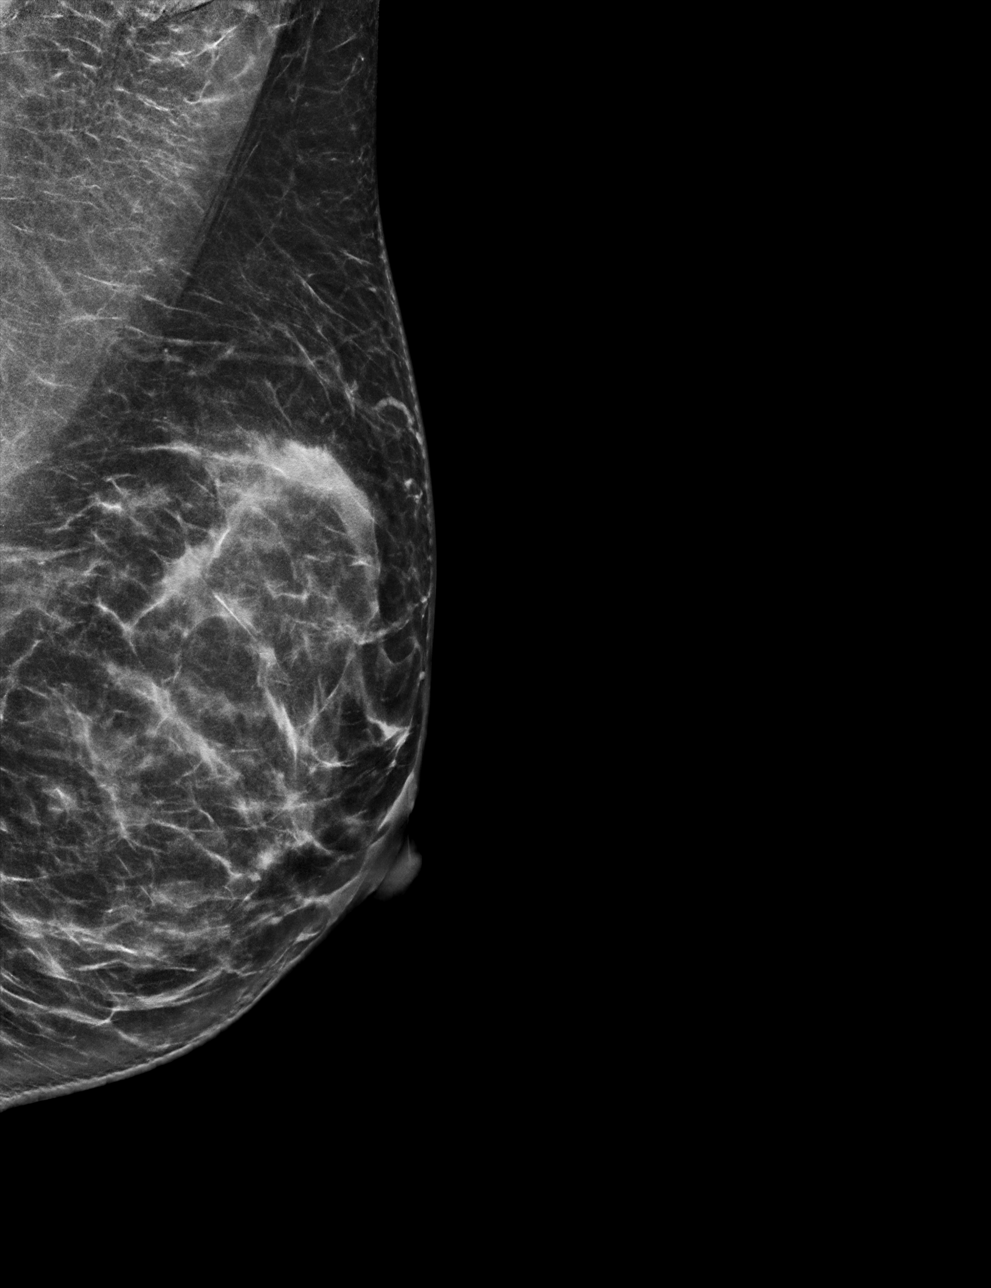

[L CC synth-2D]
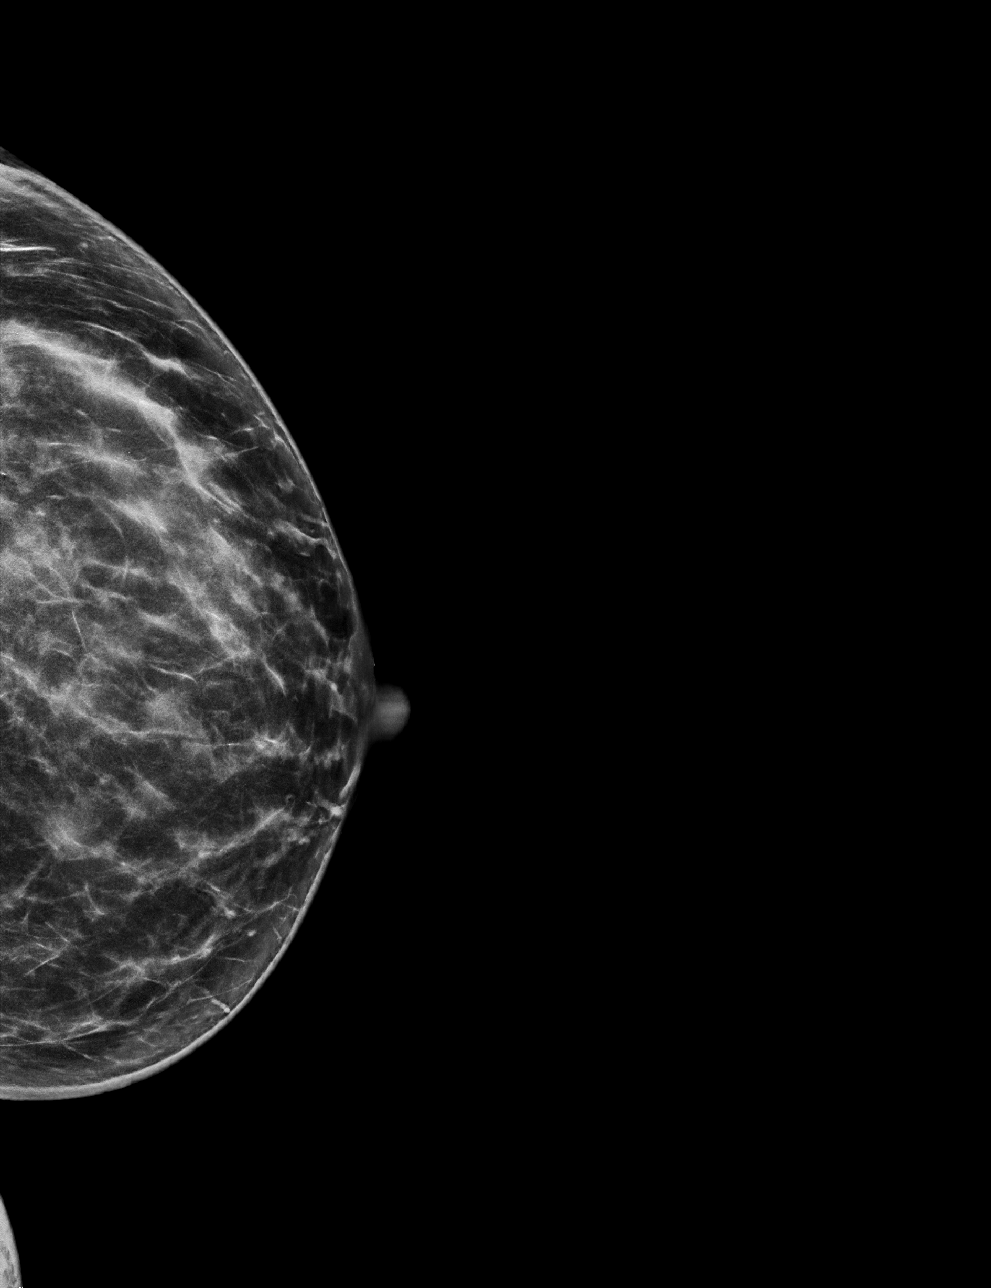

[L MLO tomo · tomo slice 34/67.0]
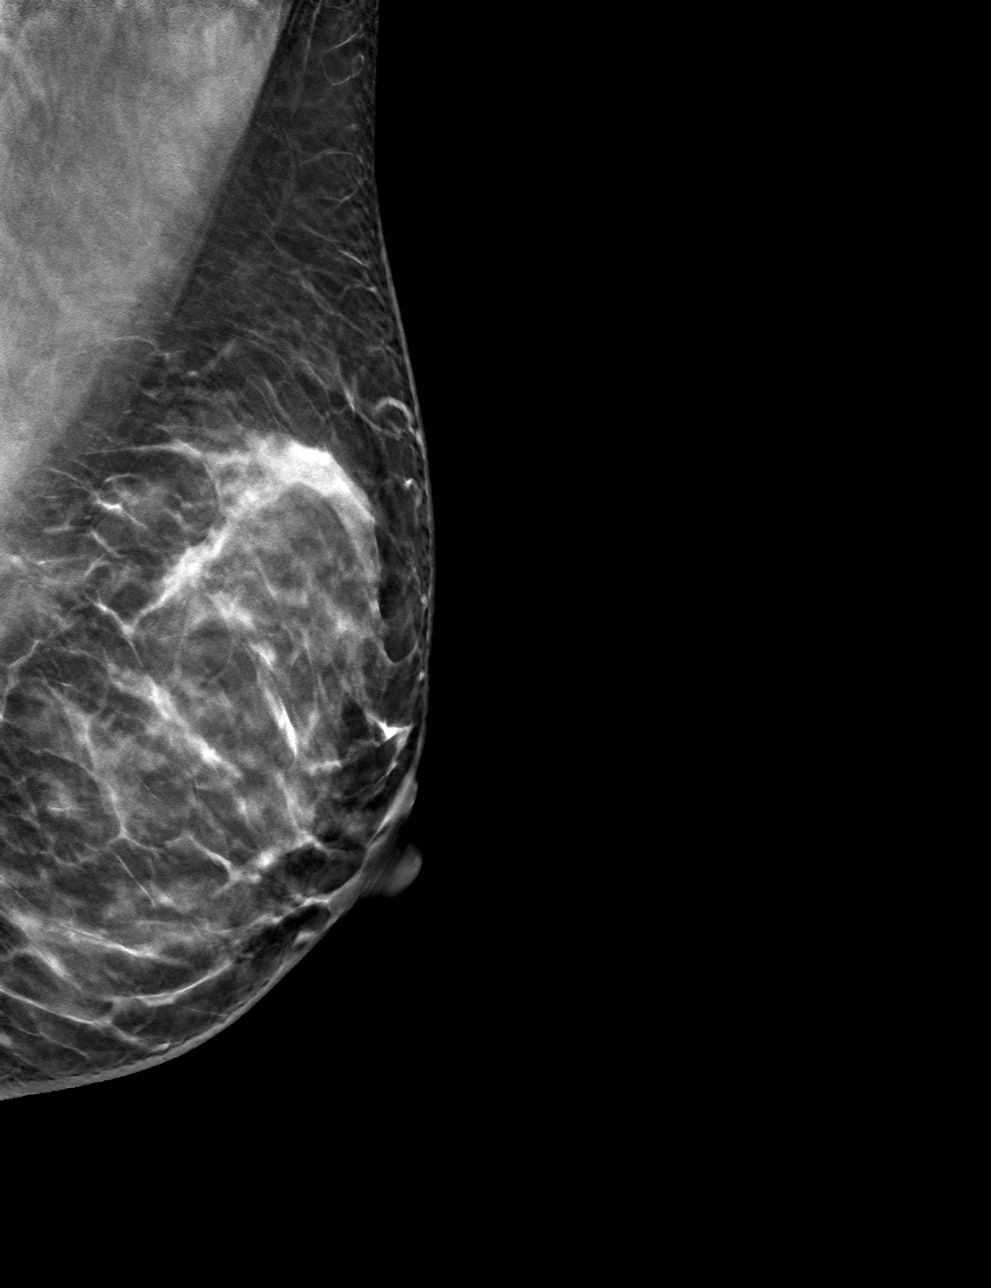

[L CC tomo · tomo slice 32/63.0]
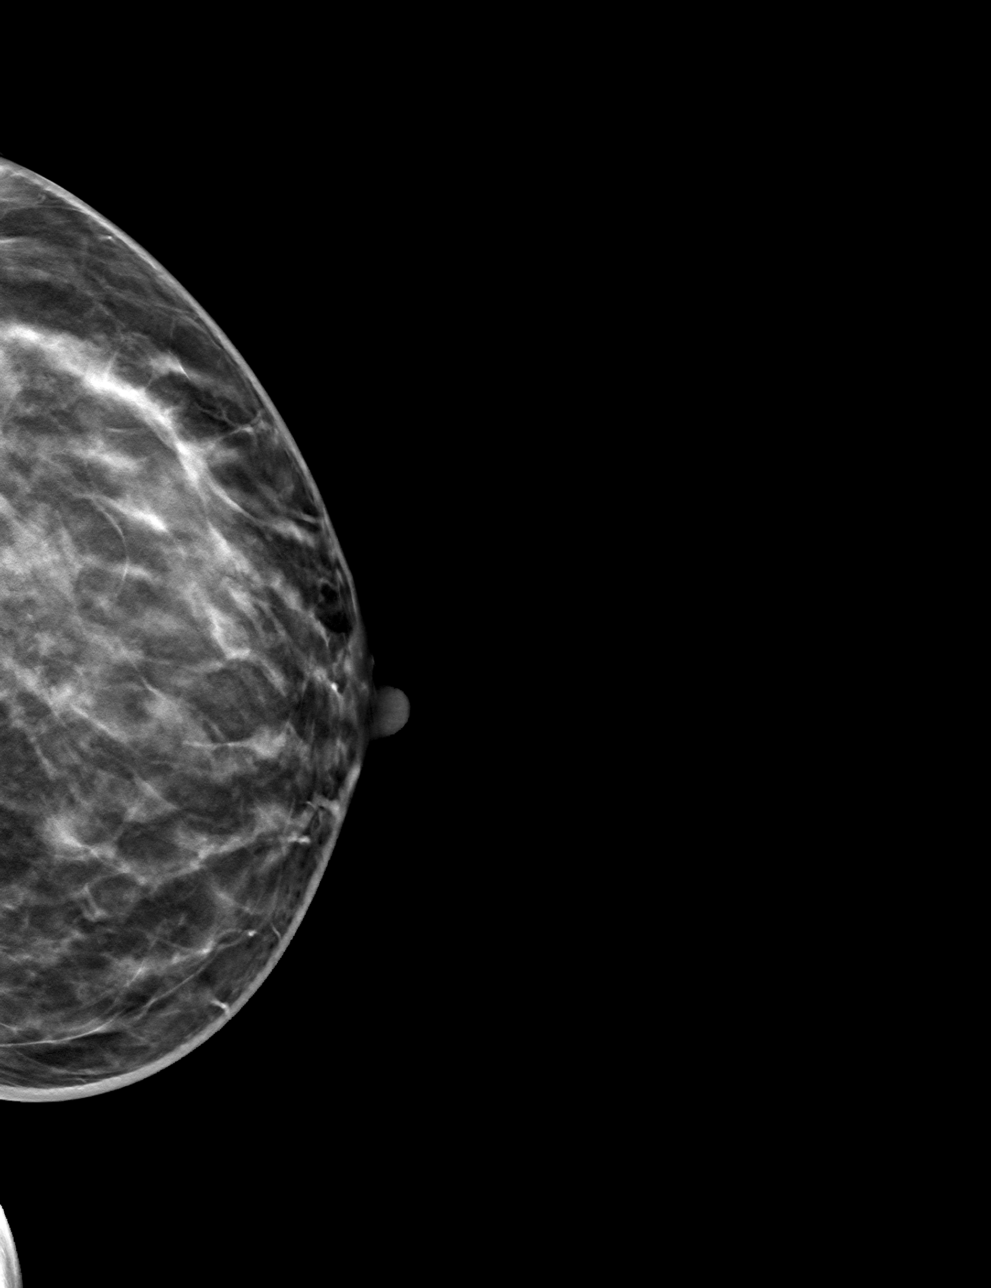

[4 of 12 positions shown; findings below may reference images not displayed]

ACR Breast Density Category c: The breast tissue is heterogeneously
dense, which may obscure small masses.
FINDINGS: The left breast asymmetry is no longer present. No evidence of
malignancy in the left breast.
IMPRESSION: No mammographic evidence of malignancy.

RECOMMENDATION:
Annual screening mammography.

I have discussed the findings and recommendations with the patient.
If applicable, a reminder letter will be sent to the patient
regarding the next appointment.

BI-RADS CATEGORY  1: Negative.

## 2024-02-26 ENCOUNTER — Ambulatory Visit
Admission: RE | Admit: 2024-02-26 | Discharge: 2024-02-26 | Disposition: A | Discharge status: Home / Self Care | Source: Ambulatory Visit | Attending: Obstetrics & Gynecology | Admitting: Obstetrics & Gynecology

## 2024-02-26 DIAGNOSIS — Z1231 Encounter for screening mammogram for malignant neoplasm of breast: Secondary | ICD-10-CM | POA: Insufficient documentation

## 2024-03-01 ENCOUNTER — Ambulatory Visit: Payer: Self-pay | Admitting: Obstetrics & Gynecology
